# Patient Record
Sex: Female | Born: 1960 | Race: White | Hispanic: No | Marital: Married | Smoking: Never smoker
Health system: Southern US, Community
[De-identification: ages and names within clinical notes are randomized; demographics above are authoritative.]

---

## 2012-02-04 ENCOUNTER — Ambulatory Visit: Payer: Self-pay | Admitting: Unknown Physician Specialty

## 2012-02-06 LAB — PATHOLOGY REPORT

## 2012-11-27 ENCOUNTER — Ambulatory Visit: Payer: Self-pay | Admitting: Family Medicine

## 2013-06-04 ENCOUNTER — Ambulatory Visit: Payer: Self-pay | Admitting: Family Medicine

## 2013-07-01 ENCOUNTER — Ambulatory Visit: Payer: Self-pay | Admitting: Surgery

## 2013-11-30 ENCOUNTER — Ambulatory Visit: Payer: Self-pay | Admitting: Nurse Practitioner

## 2014-12-07 ENCOUNTER — Ambulatory Visit: Payer: Self-pay | Admitting: Family Medicine

## 2015-05-14 ENCOUNTER — Ambulatory Visit
Admission: EM | Admit: 2015-05-14 | Discharge: 2015-05-14 | Disposition: A | Discharge status: Home / Self Care | Payer: BLUE CROSS/BLUE SHIELD | Attending: Family Medicine | Admitting: Family Medicine

## 2015-05-14 ENCOUNTER — Ambulatory Visit: Payer: BLUE CROSS/BLUE SHIELD

## 2015-05-14 DIAGNOSIS — X58XXXA Exposure to other specified factors, initial encounter: Secondary | ICD-10-CM | POA: Diagnosis not present

## 2015-05-14 DIAGNOSIS — Y92481 Parking lot as the place of occurrence of the external cause: Secondary | ICD-10-CM | POA: Insufficient documentation

## 2015-05-14 DIAGNOSIS — S82892A Other fracture of left lower leg, initial encounter for closed fracture: Secondary | ICD-10-CM

## 2015-05-14 DIAGNOSIS — M79672 Pain in left foot: Secondary | ICD-10-CM | POA: Insufficient documentation

## 2015-05-14 DIAGNOSIS — E669 Obesity, unspecified: Secondary | ICD-10-CM | POA: Insufficient documentation

## 2015-05-14 DIAGNOSIS — M25572 Pain in left ankle and joints of left foot: Secondary | ICD-10-CM | POA: Diagnosis present

## 2015-05-14 DIAGNOSIS — Z6835 Body mass index (BMI) 35.0-35.9, adult: Secondary | ICD-10-CM | POA: Insufficient documentation

## 2015-05-14 DIAGNOSIS — Z79899 Other long term (current) drug therapy: Secondary | ICD-10-CM | POA: Insufficient documentation

## 2015-05-14 MED ORDER — IBUPROFEN 800 MG PO TABS: 800.0000 mg | ORAL_TABLET | Freq: Three times a day (TID) | ORAL | Status: DC

## 2015-05-14 NOTE — ED Provider Notes (Signed)
CSN: [id number]     Arrival date & time 05/14/15  1138 History   First MD Initiated Contact with Patient 05/14/15 1230     Chief Complaint  Patient presents with  . Ankle Pain  . 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  Painful to bear weight came in with crutches from home today.  Iced/rested/elevated/took advil 4 tabs OTC last night and this am and it helped for pain at rest but worsens as soon as she tries to move, worsening bruising this am.  Patient is a 54 y.o. female presenting with ankle pain and lower extremity pain. The history is provided by the patient.  Ankle Pain Location:  Ankle Time since incident:  1 day Injury: yes   Mechanism of injury: fall   Fall:    Fall occurred:  From a vehicle   Height of fall:  3 feet   Impact surface:  Primary school teacher of impact:  Unable to specify   Entrapped after fall: no   Ankle location:  L ankle Pain details:    Quality:  Aching and shooting   Radiates to:  Does not radiate   Severity:  Severe   Onset quality:  Sudden   Duration:  1 day   Timing:  Constant   Progression:  Unchanged Chronicity:  New Dislocation: no   Foreign body present:  No foreign bodies Tetanus status:  Out of date Prior injury to area:  No Relieved by:  Nothing Worsened by:  Bearing weight, flexion and rotation Ineffective treatments:  Ice, rest, immobilization and NSAIDs Associated symptoms: decreased ROM and swelling   Associated symptoms: no back pain, no fatigue, no fever, no itching, no muscle weakness, no neck pain, no numbness, no stiffness and no tingling   Risk factors: obesity     Risk factors: no concern for non-accidental trauma, no frequent fractures, no known bone disorder and no recent illness   Foot Pain Pertinent negatives include no chest pain, no abdominal pain, no headaches and no shortness of breath.    History reviewed. No pertinent past medical history. History reviewed. No pertinent past surgical history. Family History  Problem Relation Age of Onset  . Cancer Mother   . Heart disease Father   . Thyroid disease Sister    History  Substance Use Topics  . Smoking status: Never Smoker   . Smokeless tobacco: Not on file  . Alcohol Use: No   OB History    No data available     Review of Systems  Constitutional: Negative for fever, chills, diaphoresis, activity change, appetite change and fatigue.  HENT: Negative for ear pain, facial swelling and nosebleeds.   Eyes: Negative for pain, discharge and itching.  Respiratory: Negative for cough, shortness of breath, wheezing and stridor.   Cardiovascular: Negative for chest pain, palpitations and leg swelling.  Gastrointestinal: Negative for nausea, vomiting, abdominal pain, diarrhea, constipation and rectal pain.  Endocrine: Negative for cold intolerance and heat intolerance.  Genitourinary: Negative for difficulty urinating.  Musculoskeletal: Positive for myalgias, joint swelling, arthralgias and gait problem. Negative for back pain, stiffness, neck pain and neck stiffness.  Skin: Positive for color change. Negative for itching,  pallor, rash and wound.  Allergic/Immunologic: Negative for environmental allergies and food allergies.  Neurological: Negative for dizziness, tremors, syncope, facial asymmetry, weakness, light-headedness, numbness and headaches.  Hematological: Negative for adenopathy. Does not bruise/bleed easily.  Psychiatric/Behavioral: Negative for behavioral problems, confusion, sleep disturbance and agitation.    Allergies  Penicillins  Home Medications   Prior to Admission  medications   Medication Sig Start Date End Date Taking? Authorizing Provider  ibuprofen (ADVIL,MOTRIN) 800 MG tablet Take 1 tablet (800 mg total) by mouth 3 (three) times daily. 05/14/15   Barbaraann Barthel, NP   BP 119/82 mmHg  Pulse 102  Temp(Src) 98.7 F (37.1 C) (Tympanic)  Resp 17  Ht  (1.702 m)  Wt 225 lb (102.059 kg)  BMI 35.23 kg/m2  SpO2 98% Physical Exam  Constitutional: She is oriented to person, place, and time. Vital signs are normal. She appears well-developed and well-nourished.  HENT:  Head: Normocephalic and atraumatic.  Right Ear: External ear normal.  Left Ear: External ear normal.  Nose: Nose normal.  Mouth/Throat: Oropharynx is clear and moist. No oropharyngeal exudate.  Eyes: Conjunctivae, EOM and lids are normal. Pupils are equal, round, and reactive to light. Right eye exhibits no discharge. Left eye exhibits no discharge. No scleral icterus.  Neck: Trachea normal and normal range of motion. Neck supple. No JVD present. No tracheal deviation present.  Cardiovascular: Normal rate, regular rhythm, normal heart sounds and intact distal pulses.  Exam reveals no gallop and no friction rub.   No murmur heard. Pulmonary/Chest: Effort normal and breath sounds normal. No respiratory distress. She has no wheezes. She has no rales. She exhibits no tenderness.  Abdominal: Soft.  Musculoskeletal: She exhibits edema and tenderness.       Left ankle: She exhibits decreased range of motion, swelling and ecchymosis. She exhibits no deformity, no laceration and normal pulse. Tenderness. Lateral malleolus, medial malleolus and proximal fibula tenderness found. Achilles tendon normal. Achilles tendon exhibits no pain, no defect and normal Thompson's test results.       Feet:  Pain with dorsiflexion, eversion, inversion; using crutches or wheelchair does not want to bear weight toes to ground very painful with partial weight bearing; distal tib/fib squeeze very painful    Lymphadenopathy:    She has no cervical adenopathy.  Neurological: She is alert and oriented to person, place, and time. She displays normal reflexes. Coordination normal.  Skin: Skin is warm, dry and intact. Bruising and ecchymosis noted. No abrasion, no burn, no laceration, no lesion, no petechiae and no rash noted. She is not diaphoretic. No cyanosis or erythema. No pallor. Nails show no clubbing.     Psychiatric: She has a normal mood and affect. Her speech is normal and behavior is normal. Judgment and thought content normal. Cognition and memory are normal.  Nursing note and vitals reviewed.   ED Course  Procedures (including critical care time) Labs Review Labs Reviewed - No data to display  Imaging Review Dg Ankle Complete Left  05/14/2015   CLINICAL DATA:  Fall getting into her truck last evening. Lateral ankle pain. Unable to bear weight.  EXAM: LEFT ANKLE COMPLETE - 3+ VIEW  COMPARISON:  None.  FINDINGS: A minimally displaced avulsion fracture is present at the lateral malleolus. Moderate soft tissue swelling is present over the lateral malleolus. No additional fractures are present. There is no significant joint effusion. Calcaneal spurs are noted.  IMPRESSION: 1. Minimally displaced avulsion fracture of the lateral malleolus with associated  moderate soft tissue swelling.   Electronically Signed   By: Marin Robertshristopher  Mattern M.D.   On: 05/14/2015 13:25   Dg Foot Complete Left  05/14/2015   CLINICAL DATA:  Fall last night well getting into her truck. Unable bear weight. Swelling and bruising in the left foot and ankle laterally.  EXAM: LEFT FOOT - COMPLETE 3+ VIEW  COMPARISON:  None.  FINDINGS: Soft tissue swelling is present over the lateral aspect of the ankle. A minimally displaced fracture is present in the distal tibia. A secondary center of ossification is noted lateral of cuboid. No additional fractures are present. Calcaneal spurs are noted.  IMPRESSION: 1. Minimally displaced  fracture of the lateral malleolus with associated soft tissue swelling. 2. Calcaneal spurs.   Electronically Signed   By: Marin Robertshristopher  Mattern M.D.   On: 05/14/2015 13:24     MDM   1. Avulsion fracture of ankle, left, closed, initial encounter    Patient was instructed to rest, ice and elevate the ankle as much as possible.  Wear camboot, use crutches.  Activity as tolerated and work on gentle AROM exercises.  Patient is to take motrin 800mg  po TID prn.  Discussed at risk to reinjure ankle over the next year and to wear supportive footwear/ankle sleeve/ace bandage.  Schedule follow up with ortho/podiatry this week for re-evaluation--patient stated she will contact spouse provider in [place].  Given copy of radiology report but unable to get images on disk today from radiology.  Patient to contact radiology dept during regular M-F business hours if disk needed.  Work restriction note given to patient.  Patient verbalized agreement and understanding of treatment plan.   P2:  [employer] [facility] [employer].    Barbaraann Barthelina A Avien Taha, NP 05/14/15 [phone]

## 2015-05-14 NOTE — ED Notes (Signed)
States left foot ankle ?twisted? Yesterday after being startled and fell. Unable to bear weight

## 2015-05-14 NOTE — Discharge Instructions (Signed)
Ankle Fracture with Rehab Two bones in the ankle, the shinbone (tibia) and the bone of the outer ankle and lower leg (fibula), are susceptible to being fractured. The fracture may be a complete or an incomplete break of the bone. It is also common for the ligaments of the ankle joint to be injured at the same time as a fracture. SYMPTOMS   Severe pain in the ankle at the time of injury and/or when trying to move the ankle.  Feeling of popping or tearing in the inner or outer part of the ankle, sometimes as if the ankle joint was temporarily dislocated and popped back into place.  Cracking or other sounds may be heard at the time of fracture.  Severe tenderness in the ankle.  Swelling in the ankle and foot  Blisters around the ankle (uncommon).  Bleeding and bruising (contusion) in the ankle and foot.  Inability to stand or bear weight on the injured foot.  Visible deformity if the fracture is complete and the bone fragments separate enough to distort normal leg contours.  Numbness and coldness in the foot if the blood supply is impaired. CAUSES  Bones break when subjected to a force that is greater than the their strength. Most fractures are due to direct trauma, such as being hit with an object or falling.  Fractured may also be caused by indirect stress, such as twisting, pivoting, or violent muscle contraction . RISK INCREASES WITH:  Sports that require quick changes in direction (football, soccer, or skiing).  Sports that require jumping (basketball, volleyball, distance jumping, or high jumping).  Walking or running on uneven or rough surfaces.  Shoes with inadequate support to prevent the foot and ankle from rolling over when stress occurs.  Bony abnormalities (osteoporosis or bone tumors).  Metabolic disorders, hormone problems, and nutritional deficiencies and disorders.  Poor strength and flexibility  Previous ankle injury. PREVENTION   Warm up and stretch  properly before activity.  Maintain physical fitness:  Leg and ankle strength.  Flexibility and endurance.  Cardiovascular fitness.  Wear properly fitted protective equipment (high-top shoes or when appropriate and ankle bracing, taping, or splinting), especially for the first 12 months after an ankle injury. PROGNOSIS  If treated properly, ankle fractures typically heal well. RELATED COMPLICATIONS   Failure to heal (nonunion).  Healing in poor position (malunion).  Arrest of normal bone growth in children.  Proneness to repeated ankle injury.  Stiff ankle.  Unstable or arthritic ankle.  Infected skin blisters.  Prolonged healing time if activity is resumed too quickly. Risks of surgery, including infection, bleeding, injury to nerves (numbness, weakness, paralysis), and need for further surgery. TREATMENT  Treatment initially consists of ice and medication to help reduce pain and inflammation. The joint must be immobilized to allow for healing. If the fracture is where the bones are out of alignment (displaced), then surgery may be necessary to realign (reduce) them. Surgery usually involves placing pins and screws in the bones to hold them in place while the fracture heals. After surgery the joint is immobilized. Bone growth stimulators may be used to promote bone growth, but this is uncommon, Strengthening and stretching exercises are usually necessary after immobilization in order to regain strength and a full range of motion. These exercises may be completed at home or with a therapist. If pins and screws are placed in the bone, they are not usually removed unless they become a source of pain. MEDICATION   If pain medication is necessary,  then nonsteroidal anti-inflammatory medications, such as aspirin and ibuprofen, or other minor pain relievers, such as acetaminophen, are often recommended.  Do not take pain medication within 7 days before surgery.  Prescription pain  relievers may be prescribed if deemed necessary by your caregiver. Use only as directed and only as much as you need. SEEK MEDICAL CARE IF:   Symptoms get worse or do not improve in 2 weeks despite treatment.  The following occur after immobilization or surgery:  Swelling above or below the fracture site.  Severe, persistent pain.  Blue or gray skin below the fracture site, especially under the nails, or numbness or loss of feeling below the fracture site. Report any of these signs immediately.  New, unexplained symptoms develop (drugs used in treatment may produce side effects). EXERCISES  RANGE OF MOTION (ROM) AND STRETCHING EXERCISES - Ankle Fracture These exercises may help you when beginning to rehabilitate your injury. Your symptoms may resolve with or without further involvement from your physician, physical therapist or athletic trainer. While completing these exercises, remember:   Restoring tissue flexibility helps normal motion to return to the joints. This allows healthier, less painful movement and activity.  An effective stretch should be held for at least 30 seconds.  A stretch should never be painful. You should only feel a gentle lengthening or release in the stretched tissue. RANGE OF MOTION - Dorsi/Plantar Flexion  While sitting with your right / left knee straight, draw the top of your foot upwards by flexing your ankle. Then reverse the motion, pointing your toes downward.  Hold each position for __________ seconds.  After completing your first set of exercises, repeat this exercise with your knee bent. Repeat __________ times. Complete this exercise __________ times per day.  RANGE OF MOTION- Ankle Plantar Flexion   Sit with your right / left leg crossed over your opposite knee.  Use your opposite hand to pull the top of your foot and toes toward you.  You should feel a gentle stretch on the top of your foot/ankle. Hold this position for __________  seconds. Repeat __________ times. Complete __________ times per day.  RANGE OF MOTION - Ankle Eversion  Sit with your right / left ankle crossed over your opposite knee.  Grip your foot with your opposite hand, placing your thumb on the top of your foot and your fingers across the bottom of your foot.  Gently push your foot downward with a slight rotation so your littlest toes rise slightly  You should feel a gentle stretch on the inside of your ankle. Hold the stretch for __________ seconds. Repeat __________ times. Complete this exercise __________ times per day.  RANGE OF MOTION - Ankle Inversion  Sit with your right / left ankle crossed over your opposite knee.  Grip your foot with your opposite hand, placing your thumb on the bottom of your foot and your fingers across the top of your foot.  Gently pull your foot so the smallest toe comes toward you and your thumb pushes the inside of the ball of your foot away from you.  You should feel a gentle stretch on the outside of your ankle. Hold the stretch for __________ seconds. Repeat __________ times. Complete this exercise __________ times per day.  RANGE OF MOTION - Ankle Alphabet  Imagine your right / left big toe is a pen.  Keeping your hip and knee still, write out the entire alphabet with your "pen." Make the letters as large as you can  without increasing any discomfort. Repeat __________ times. Complete this exercise __________ times per day.  RANGE OF MOTION - Ankle Dorsiflexion, Active Assisted   Remove shoes and sit on a chair that is preferably not on a carpeted surface.  Place right / left foot under knee. Extend your opposite leg for support.  Keeping your heel down, slide your right / left foot back toward the chair until you feel a stretch at your ankle or calf. If you do not feel a stretch, slide your bottom forward to the edge of the chair, while still keeping your heel down.  Hold this stretch for __________  seconds. Repeat __________ times. Complete this stretch __________ times per day.  STRETCH - Gastrocsoleus   Sit with your right / left leg extended. Holding onto both ends of a belt or towel, loop it around the ball of your foot.  Keeping your right / left ankle and foot relaxed and your knee straight, pull your foot and ankle toward you using the belt/towel.  You should feel a gentle stretch behind your calf or knee. Hold this position for __________ seconds. Repeat __________ times. Complete this stretch __________ times per day.  STRENGTHENING EXERCISES - Ankle Fracture These exercises may help you when beginning to rehabilitate your injury. They may resolve your symptoms with or without further involvement from your physician, physical therapist or athletic trainer. While completing these exercises, remember:   Muscles can gain both the endurance and the strength needed for everyday activities through controlled exercises.  Complete these exercises as instructed by your physician, physical therapist or athletic trainer. Progress the resistance and repetitions only as guided.  You may experience muscle soreness or fatigue, but the pain or discomfort you are trying to eliminate should never worsen during these exercises. If this pain does worsen, stop and make certain you are following the directions exactly. If the pain is still present after adjustments, discontinue the exercise until you can discuss the trouble with your clinician. STRENGTH - Dorsiflexors  Secure a rubber exercise band/tubing to a fixed object (table, pole) and loop the other end around your right / left foot.  Sit on the floor facing the fixed object. The band/tubing should be slightly tense when your foot is relaxed.  Slowly draw your foot back toward you using your ankle and toes.  Hold this position for __________ seconds. Slowly release the tension in the band and return your foot to the starting  position. Repeat __________ times. Complete this exercise __________ times per day.  STRENGTH - Plantar-flexors  Sit with your right / left leg extended. Holding onto both ends of a rubber exercise band/tubing, loop it around the ball of your foot. Keep a slight tension in the band.  Slowly push your toes away from you, pointing them downward.  Hold this position for __________ seconds. Return slowly, controlling the tension in the band/tubing. Repeat __________ times. Complete this exercise __________ times per day.  STRENGTH - Ankle Eversion  Secure one end of a rubber exercise band/tubing to a fixed object (table, pole). Loop the other end around your foot just before your toes.  Place your fists between your knees. This will focus your strengthening at your ankle.  Drawing the band/tubing across your opposite foot, slowly, pull your little toe out and up. Make sure the band/tubing is positioned to resist the entire motion.  Hold this position for __________ seconds.  Have your muscles resist the band/tubing as it slowly pulls your foot  back to the starting position. Repeat __________ times. Complete this exercise __________ times per day.  STRENGTH - Ankle Inversion  Secure one end of a rubber exercise band/tubing to a fixed object (table, pole). Loop the other end around your foot just before your toes.  Place your fists between your knees. This will focus your strengthening at your ankle.  Slowly, pull your big toe up and in, making sure the band/tubing is positioned to resist the entire motion.  Hold this position for __________ seconds.  Have your muscles resist the band/tubing as it slowly pulls your foot back to the starting position. Repeat __________ times. Complete this exercises __________ times per day.  STRENGTH - Towel Curls  Sit in a chair positioned on a non-carpeted surface.  Place your foot on a towel, keeping your heel on the floor.  Pull the towel toward  your heel by only curling your toes. Keep your heel on the floor.  If instructed by your physician, physical therapist or athletic trainer, add weight at the end of the towel. Repeat __________ times. Complete this exercise __________ times per day. STRENGTH - Plantar-flexors, Standing   Stand with your feet shoulder width apart. Steady yourself with a wall or table using as little support as needed.  Keeping your weight evenly spread over the width of your feet, rise up on your toes.*  Hold this position for __________ seconds. Repeat __________ times. Complete this exercise __________ times per day.  *If this is too easy, shift your weight toward your right / left leg until you feel challenged. Ultimately, you may be asked to do this exercise with your right / left foot only. Document Released: 06/26/2005 Document Revised: 02/17/2012 Document Reviewed: 03/09/2009 [facility] Patient Information 2015 [place], [place]. This information is not intended to replace advice given to you by your health care provider. Make sure you discuss any questions you have with your health care provider. Acute Ankle Sprain with Phase I Rehab An acute ankle sprain is a partial or complete tear in one or more of the ligaments of the ankle due to traumatic injury. The severity of the injury depends on both the number of ligaments sprained and the grade of sprain. There are 3 grades of sprains.   A grade 1 sprain is a mild sprain. There is a slight pull without obvious tearing. There is no loss of strength, and the muscle and ligament are the correct length.  A grade 2 sprain is a moderate sprain. There is tearing of fibers within the substance of the ligament where it connects two bones or two cartilages. The length of the ligament is increased, and there is usually decreased strength.  A grade 3 sprain is a complete rupture of the ligament and is uncommon. In addition to the grade of sprain, there are three types of  ankle sprains.  Lateral ankle sprains: This is a sprain of one or more of the three ligaments on the outer side (lateral) of the ankle. These are the most common sprains. Medial ankle sprains: There is one large triangular ligament of the inner side (medial) of the ankle that is susceptible to injury. Medial ankle sprains are less common. Syndesmosis, "high ankle," sprains: The syndesmosis is the ligament that connects the two bones of the lower leg. Syndesmosis sprains usually only occur with very severe ankle sprains. SYMPTOMS  Pain, tenderness, and swelling in the ankle, starting at the side of injury that may progress to the whole ankle and foot  with time.  "Pop" or tearing sensation at the time of injury.  Bruising that may spread to the heel.  Impaired ability to walk soon after injury. CAUSES   Acute ankle sprains are caused by trauma placed on the ankle that temporarily forces or pries the anklebone (talus) out of its normal socket.  Stretching or tearing of the ligaments that normally hold the joint in place (usually due to a twisting injury). RISK INCREASES WITH:  Previous ankle sprain.  Sports in which the foot may land awkwardly (i.e., basketball, volleyball, or soccer) or walking or running on uneven or rough surfaces.  Shoes with inadequate support to prevent sideways motion when stress occurs.  Poor strength and flexibility.  Poor balance skills.  Contact sports. PREVENTION   Warm up and stretch properly before activity.  Maintain physical fitness:  Ankle and leg flexibility, muscle strength, and endurance.  Cardiovascular fitness.  Balance training activities.  Use proper technique and have a coach correct improper technique.  Taping, protective strapping, bracing, or high-top tennis shoes may help prevent injury. Initially, tape is best; however, it loses most of its support function within 10 to 15 minutes.  Wear proper-fitted protective shoes  (High-top shoes with taping or bracing is more effective than either alone).  Provide the ankle with support during sports and practice activities for 12 months following injury. PROGNOSIS   If treated properly, ankle sprains can be expected to recover completely; however, the length of recovery depends on the degree of injury.  A grade 1 sprain usually heals enough in 5 to 7 days to allow modified activity and requires an average of 6 weeks to heal completely.  A grade 2 sprain requires 6 to 10 weeks to heal completely.  A grade 3 sprain requires 12 to 16 weeks to heal.  A syndesmosis sprain often takes more than 3 months to heal. RELATED COMPLICATIONS   Frequent recurrence of symptoms may result in a chronic problem. Appropriately addressing the problem the first time decreases the frequency of recurrence and optimizes healing time. Severity of the initial sprain does not predict the likelihood of later instability.  Injury to other structures (bone, cartilage, or tendon).  A chronically unstable or arthritic ankle joint is a possibility with repeated sprains. TREATMENT Treatment initially involves the use of ice, medication, and compression bandages to help reduce pain and inflammation. Ankle sprains are usually immobilized in a walking cast or boot to allow for healing. Crutches may be recommended to reduce pressure on the injury. After immobilization, strengthening and stretching exercises may be necessary to regain strength and a full range of motion. Surgery is rarely needed to treat ankle sprains. MEDICATION   Nonsteroidal anti-inflammatory medications, such as aspirin and ibuprofen (do not take for the first 3 days after injury or within 7 days before surgery), or other minor pain relievers, such as acetaminophen, are often recommended. Take these as directed by your caregiver. Contact your caregiver immediately if any bleeding, stomach upset, or signs of an allergic reaction occur  from these medications.  Ointments applied to the skin may be helpful.  Pain relievers may be prescribed as necessary by your caregiver. Do not take prescription pain medication for longer than 4 to 7 days. Use only as directed and only as much as you need. HEAT AND COLD  Cold treatment (icing) is used to relieve pain and reduce inflammation for acute and chronic cases. Cold should be applied for 10 to 15 minutes every 2 to 3  hours for inflammation and pain and immediately after any activity that aggravates your symptoms. Use ice packs or an ice massage.  Heat treatment may be used before performing stretching and strengthening activities prescribed by your caregiver. Use a heat pack or a warm soak. SEEK IMMEDIATE MEDICAL CARE IF:   Pain, swelling, or bruising worsens despite treatment.  You experience pain, numbness, discoloration, or coldness in the foot or toes.  New, unexplained symptoms develop (drugs used in treatment may produce side effects.) EXERCISES  PHASE I EXERCISES RANGE OF MOTION (ROM) AND STRETCHING EXERCISES - Ankle Sprain, Acute Phase I, Weeks 1 to 2 These exercises may help you when beginning to restore flexibility in your ankle. You will likely work on these exercises for the 1 to 2 weeks after your injury. Once your physician, physical therapist, or athletic trainer sees adequate progress, he or she will advance your exercises. While completing these exercises, remember:   Restoring tissue flexibility helps normal motion to return to the joints. This allows healthier, less painful movement and activity.  An effective stretch should be held for at least 30 seconds.  A stretch should never be painful. You should only feel a gentle lengthening or release in the stretched tissue. RANGE OF MOTION - Dorsi/Plantar Flexion  While sitting with your right / left knee straight, draw the top of your foot upwards by flexing your ankle. Then reverse the motion, pointing your toes  downward.  Hold each position for __________ seconds.  After completing your first set of exercises, repeat this exercise with your knee bent. Repeat __________ times. Complete this exercise __________ times per day.  RANGE OF MOTION - Ankle Alphabet  Imagine your right / left big toe is a pen.  Keeping your hip and knee still, write out the entire alphabet with your "pen." Make the letters as large as you can without increasing any discomfort. Repeat __________ times. Complete this exercise __________ times per day.  STRENGTHENING EXERCISES - Ankle Sprain, Acute -Phase I, Weeks 1 to 2 These exercises may help you when beginning to restore strength in your ankle. You will likely work on these exercises for 1 to 2 weeks after your injury. Once your physician, physical therapist, or athletic trainer sees adequate progress, he or she will advance your exercises. While completing these exercises, remember:   Muscles can gain both the endurance and the strength needed for everyday activities through controlled exercises.  Complete these exercises as instructed by your physician, physical therapist, or athletic trainer. Progress the resistance and repetitions only as guided.  You may experience muscle soreness or fatigue, but the pain or discomfort you are trying to eliminate should never worsen during these exercises. If this pain does worsen, stop and make certain you are following the directions exactly. If the pain is still present after adjustments, discontinue the exercise until you can discuss the trouble with your clinician. STRENGTH - Dorsiflexors  Secure a rubber exercise band/tubing to a fixed object (i.e., table, pole) and loop the other end around your right / left foot.  Sit on the floor facing the fixed object. The band/tubing should be slightly tense when your foot is relaxed.  Slowly draw your foot back toward you using your ankle and toes.  Hold this position for __________  seconds. Slowly release the tension in the band and return your foot to the starting position. Repeat __________ times. Complete this exercise __________ times per day.  STRENGTH - Plantar-flexors   Sit with your  right / left leg extended. Holding onto both ends of a rubber exercise band/tubing, loop it around the ball of your foot. Keep a slight tension in the band.  Slowly push your toes away from you, pointing them downward.  Hold this position for __________ seconds. Return slowly, controlling the tension in the band/tubing. Repeat __________ times. Complete this exercise __________ times per day.  STRENGTH - Ankle Eversion  Secure one end of a rubber exercise band/tubing to a fixed object (table, pole). Loop the other end around your foot just before your toes.  Place your fists between your knees. This will focus your strengthening at your ankle.  Drawing the band/tubing across your opposite foot, slowly, pull your little toe out and up. Make sure the band/tubing is positioned to resist the entire motion.  Hold this position for __________ seconds. Have your muscles resist the band/tubing as it slowly pulls your foot back to the starting position.  Repeat __________ times. Complete this exercise __________ times per day.  STRENGTH - Ankle Inversion  Secure one end of a rubber exercise band/tubing to a fixed object (table, pole). Loop the other end around your foot just before your toes.  Place your fists between your knees. This will focus your strengthening at your ankle.  Slowly, pull your big toe up and in, making sure the band/tubing is positioned to resist the entire motion.  Hold this position for __________ seconds.  Have your muscles resist the band/tubing as it slowly pulls your foot back to the starting position. Repeat __________ times. Complete this exercises __________ times per day.  STRENGTH - Towel Curls  Sit in a chair positioned on a non-carpeted  surface.  Place your right / left foot on a towel, keeping your heel on the floor.  Pull the towel toward your heel by only curling your toes. Keep your heel on the floor.  If instructed by your physician, physical therapist, or athletic trainer, add weight to the end of the towel. Repeat __________ times. Complete this exercise __________ times per day. Document Released: 06/26/2005 Document Revised: 04/11/2014 Document Reviewed: 03/09/2009 [facility] Patient Information 2015 [place], [place]. This information is not intended to replace advice given to you by your health care provider. Make sure you discuss any questions you have with your health care provider.

## 2016-10-22 ENCOUNTER — Ambulatory Visit
Admission: EM | Admit: 2016-10-22 | Discharge: 2016-10-22 | Disposition: A | Discharge status: Home / Self Care | Payer: BLUE CROSS/BLUE SHIELD | Attending: Family Medicine | Admitting: Family Medicine

## 2016-10-22 ENCOUNTER — Ambulatory Visit (INDEPENDENT_AMBULATORY_CARE_PROVIDER_SITE_OTHER): Payer: BLUE CROSS/BLUE SHIELD

## 2016-10-22 ENCOUNTER — Encounter: Payer: Self-pay | Admitting: *Deleted

## 2016-10-22 DIAGNOSIS — W19XXXA Unspecified fall, initial encounter: Secondary | ICD-10-CM | POA: Diagnosis not present

## 2016-10-22 DIAGNOSIS — S63501A Unspecified sprain of right wrist, initial encounter: Secondary | ICD-10-CM

## 2016-10-22 NOTE — ED Provider Notes (Signed)
CSN: [id number]     Arrival date & time 10/22/16  1557 History   First MD Initiated Contact with Patient 10/22/16 1714     Chief Complaint  Patient presents with  . Wrist Pain   (Consider location/radiation/quality/duration/timing/severity/associated sxs/prior Treatment) HPI  55 year old female who states last night she slipped on a dog poorly in the kitchen and fell landing on her stretched right dominant hand to break her fall. He says that that today it seemed to hurt worse particularly using the track ball at work. He has noticed some swelling over the distal forearm.       History reviewed. No pertinent past medical history. History reviewed. No pertinent surgical history. Family History  Problem Relation Age of Onset  . Cancer Mother   . Heart disease Father   . Thyroid disease Sister    Social History  Substance Use Topics  . Smoking status: Never Smoker  . Smokeless tobacco: Never Used  . Alcohol use No   OB History    No data available     Review of Systems  Constitutional: Positive for activity change. Negative for chills and fatigue.  Musculoskeletal: Positive for arthralgias and myalgias.  All other systems reviewed and are negative.   Allergies  Penicillins  Home Medications   Prior to Admission medications   Medication Sig Start Date End Date Taking? Authorizing Provider  ibuprofen (ADVIL,MOTRIN) 800 MG tablet Take 1 tablet (800 mg total) by mouth 3 (three) times daily. 05/14/15   Barbaraann Barthelina A Betancourt, NP   Meds Ordered and Administered this Visit  Medications - No data to display  BP 118/82 (BP Location: Left Arm)   Pulse 80   Temp 98.6 F (37 C) (Oral)   Resp 16   Ht 5\' 8"  (1.727 m)   Wt 228 lb (103.4 kg)   SpO2 97%   BMI 34.67 kg/m  No data found.   Physical Exam  Constitutional: She is oriented to person, place, and time. She appears well-developed and well-nourished. No distress.  HENT:  Head: Normocephalic and atraumatic.  Eyes: EOM  are normal. Pupils are equal, round, and reactive to light.  Neck: Normal range of motion. Neck supple.  Musculoskeletal: She exhibits edema and tenderness. She exhibits no deformity.  Examination of the right been evaluated by hand and wrist show some swelling over the dorsum of the distal forearm. There is no ecchymosis or erythema present. Range of motion shows approximately 5-10 of full dorsiflexion. Volar is normal. Pronation supination are intact. Tenderness is over the distal forearm where the swelling is maximal. Some mild tenderness over the carpal navicular is only slightly less than that on the left. Motion of the fingers is full and Comfortable normal.  Neurological: She is alert and oriented to person, place, and time.  Skin: Skin is warm and dry. She is not diaphoretic.  Psychiatric: She has a normal mood and affect. Her behavior is normal. Judgment and thought content normal.  Nursing note and vitals reviewed.   Urgent Care Course   Clinical Course     Procedures (including critical care time)  Labs Review Labs Reviewed - No data to display  Imaging Review Dg Forearm Right  Result Date: 10/22/2016 CLINICAL DATA:  Fall. Pain and swelling along the radial styloid and forearm. EXAM: RIGHT FOREARM - 2 VIEW COMPARISON:  None. FINDINGS: Possible dorsal soft tissue swelling along the forearm. Slightly irregular tip of the ulnar styloid process but without obvious cortical discontinuity. No radial fracture  is identified. IMPRESSION: 1. Soft tissue swelling dorsally along the forearm. No radial or ulnar fracture identified. Electronically Signed   By: Gaylyn RongWalter  Liebkemann M.D.   On: 10/22/2016 17:13   Dg Wrist Complete Right  Result Date: 10/22/2016 CLINICAL DATA:  Fall. Pain and swelling along the radial styloid process. EXAM: RIGHT WRIST - COMPLETE 3+ VIEW COMPARISON:  None. FINDINGS: No fracture of the radial styloid is identified. Slight irregularity of the tip of the ulnar  styloid could be from an old injury but I do not see current bony discontinuity to suggest acute fracture. Pronator fat pad unremarkable. There is some slight mid scaphoid ridging without definite cortical discontinuity. IMPRESSION: 1. Mild chronic appearing irregularity of the ulnar styloid tip, no definite cortical discontinuity to suggest fracture. 2. Slight mid scaphoid ridging without a definite scaphoid fracture; consider cross-sectional imaging if the patient has pain directly over the anatomic snuffbox to exclude occult scaphoid injury. Electronically Signed   By: Gaylyn RongWalter  Liebkemann M.D.   On: 10/22/2016 17:18     Visual Acuity Review  Right Eye Distance:   Left Eye Distance:   Bilateral Distance:    Right Eye Near:   Left Eye Near:    Bilateral Near:     Patient was given a wrist splint    MDM   1. Fall, initial encounter   2. Sprain of right wrist, initial encounter    Postoperative patient in a wrist splint for comfort. She can come out of it for personal care. Recommended elevation and ice as much as feasible tonight tomorrow. She can use Motrin or Advil for pain control. Discussed with her possible fracture of the carpal navicular which may be occult. If she continues to have problems next week with pain or discomfort with with use she follow-up with an orthopedic surgeon.    Lutricia FeilWilliam P Raeanna Soberanes, PA-C 10/22/16 1755

## 2016-10-22 NOTE — ED Triage Notes (Signed)
Pt slipped and fell last night at home. Landed on right arm. Now c/o right wrist pain and edema.

## 2016-10-25 ENCOUNTER — Telehealth: Payer: Self-pay | Admitting: *Deleted

## 2016-10-25 NOTE — Telephone Encounter (Signed)
Courtesy call back, no answer, left message for patient to call back if any questions or concerns should arise.

## 2017-11-21 ENCOUNTER — Encounter (INDEPENDENT_AMBULATORY_CARE_PROVIDER_SITE_OTHER): Payer: Self-pay | Admitting: Orthopaedic Surgery

## 2017-11-21 ENCOUNTER — Ambulatory Visit (INDEPENDENT_AMBULATORY_CARE_PROVIDER_SITE_OTHER): Payer: BLUE CROSS/BLUE SHIELD

## 2017-11-21 ENCOUNTER — Ambulatory Visit (INDEPENDENT_AMBULATORY_CARE_PROVIDER_SITE_OTHER): Payer: BLUE CROSS/BLUE SHIELD | Admitting: Orthopaedic Surgery

## 2017-11-21 VITALS — BP 120/76 | HR 69 | Ht 67.0 in | Wt 204.0 lb

## 2017-11-21 DIAGNOSIS — M545 Low back pain: Secondary | ICD-10-CM | POA: Diagnosis not present

## 2017-11-21 DIAGNOSIS — G8929 Other chronic pain: Secondary | ICD-10-CM

## 2017-11-21 NOTE — Progress Notes (Signed)
Office Visit Note   Patient: Jessica Cummings           Date of Birth: 10/17/1961           MRN: [id number] Visit Date: 11/21/2017              Requested by: Rolm GalaGrandis, Heidi, MD [address] [place], [place] [id number] PCP: Rolm GalaGrandis, Heidi, MD   Assessment & Plan: Visit Diagnoses:  1. Chronic bilateral low back pain, with sciatica presence unspecified     Plan: We reviewed her x-rays which demonstrates mild narrowing at L5-S1 with some degenerative facet changes.  No evidence of radiculopathy on exam she is neurologically intact.  To work on a walking program, core strengthening, and weight loss to improve her symptoms.  We reviewed symptoms of radiculopathy claudication and if these develop she can return.  We discussed  Using NSAIDs  PRN as needed.  She will return if she has increased symptoms.  Follow-Up Instructions: No Follow-up on file.   Orders:  Orders Placed This Encounter  Procedures  . XR Lumbar Spine 2-3 Views   No orders of the defined types were placed in this encounter.     Procedures: No procedures performed   Clinical Data: No additional findings.   Subjective: Chief Complaint  Patient presents with  . Lower Back - Pain    HPI [age] year old female seen with complaint of back pain.  States she has pain when she stands and sometimes shoots down her legs to her knees.  Both thighs.  She feels a popping in her back.  Sometimes onset of pain is when she is walking it tends to get better.  No neurogenic claudication symptoms.  Bowel or bladder symptoms.  Had ibuprofen in the past for this 800 mg but is currently not taking it.  Review of Systems healthy she had an ankle sprain last year.  Smoked is alcohol.  Positive for the occasional headaches.  Previous cystoscopy.  Mild hearing loss.  Otherwise negative as it pertains HPI.   Objective: Vital Signs: BP 120/76   Pulse 69   Ht 5\' 7"  (1.702 m)   Wt 204 lb (92.5 kg)   BMI 31.95 kg/m   Physical Exam    Constitutional: She is oriented to person, place, and time. She appears well-developed.  HENT:  Head: Normocephalic.  Right Ear: External ear normal.  Left Ear: External ear normal.  Slight decrease hearing.   Eyes: Pupils are equal, round, and reactive to light.  Neck: No tracheal deviation present. No thyromegaly present.  Cardiovascular: Normal rate.  Pulmonary/Chest: Effort normal.  Abdominal: Soft.  Neurological: She is alert and oriented to person, place, and time.  Skin: Skin is warm and dry.  Psychiatric: She has a normal mood and affect. Her behavior is normal.    Ortho Exam flexion.  Negative straight leg raising negative logroll.  Knee and ankle jerk are 2+ and symmetrical.  No isolated motor weakness.  Patient is able to heel and toe walk.  Pulses are 2+.No  Quad or calf atrophy.  Peroneals are strong.  Specialty Comments:  No specialty comments available.  Imaging: Xr Lumbar Spine 2-3 Views  Result Date: 11/21/2017 AP lateral lumbar spine x-rays demonstrate some L4-5 facet spurring without disc space narrowing.  No scoliosis.  Mild L5-S1 disc narrowing.  Hip joints and pelvis are normal. Impression: Slight narrowing L5-S1 disc and slight lumbar facet degenerative changes.    PMFS History: There are no active  problems to display for this patient.  No past medical history on file.  Family History  Problem Relation Age of Onset  . Cancer Mother   . Heart disease Father   . Thyroid disease Sister     No past surgical history on file. Social History   Occupational History  . Not on file  Tobacco Use  . Smoking status: Never Smoker  . Smokeless tobacco: Never Used  Substance and Sexual Activity  . Alcohol use: No  . Drug use: Not on file  . Sexual activity: Not on file

## 2017-11-25 ENCOUNTER — Encounter (INDEPENDENT_AMBULATORY_CARE_PROVIDER_SITE_OTHER): Payer: Self-pay | Admitting: Orthopaedic Surgery

## 2018-08-26 ENCOUNTER — Encounter: Payer: Self-pay | Admitting: Emergency Medicine

## 2018-08-26 ENCOUNTER — Other Ambulatory Visit: Payer: Self-pay

## 2018-08-26 ENCOUNTER — Ambulatory Visit (INDEPENDENT_AMBULATORY_CARE_PROVIDER_SITE_OTHER): Payer: BLUE CROSS/BLUE SHIELD

## 2018-08-26 ENCOUNTER — Ambulatory Visit
Admission: EM | Admit: 2018-08-26 | Discharge: 2018-08-26 | Disposition: A | Discharge status: Home / Self Care | Payer: BLUE CROSS/BLUE SHIELD | Attending: Family Medicine | Admitting: Family Medicine

## 2018-08-26 DIAGNOSIS — M778 Other enthesopathies, not elsewhere classified: Secondary | ICD-10-CM

## 2018-08-26 DIAGNOSIS — M65842 Other synovitis and tenosynovitis, left hand: Secondary | ICD-10-CM

## 2018-08-26 DIAGNOSIS — M779 Enthesopathy, unspecified: Principal | ICD-10-CM

## 2018-08-26 DIAGNOSIS — M79642 Pain in left hand: Secondary | ICD-10-CM

## 2018-08-26 DIAGNOSIS — M79645 Pain in left finger(s): Secondary | ICD-10-CM

## 2018-08-26 MED ORDER — HYDROCODONE-ACETAMINOPHEN 5-325 MG PO TABS: ORAL_TABLET | ORAL | 0 refills | Status: DC

## 2018-08-26 NOTE — ED Triage Notes (Signed)
Patient c/o left thumb pain that started on Monday. She stated that this morning she woke up and now has pain in her thumb and pointer finger. Patient denies injury.

## 2018-08-26 NOTE — ED Provider Notes (Signed)
[facility]    CSN: [id number] Arrival date & time: 08/26/18  1804     History   Chief Complaint Chief Complaint  Patient presents with  . Hand Pain    HPI Galya Dunnigan is a 57 y.o. female.   57 yo female with a c/o left hand/finger pain x 3 days after doing yard work over the weekend. Does not recall any specific traumatic injury.   The history is provided by the patient.  Hand Pain     History reviewed. No pertinent past medical history.  There are no active problems to display for this patient.   History reviewed. No pertinent surgical history.  OB History   None      Home Medications    Prior to Admission medications   Medication Sig Start Date End Date Taking? Authorizing Provider  HYDROcodone-acetaminophen (NORCO/VICODIN) 5-325 MG tablet 1-2 tabs po qd 08/26/18   Payton Mccallum, MD  ibuprofen (ADVIL,MOTRIN) 800 MG tablet Take 1 tablet (800 mg total) by mouth 3 (three) times daily. Patient not taking: Reported on 11/21/2017 05/14/15   Barbaraann Barthel, NP    Family History Family History  Problem Relation Age of Onset  . Cancer Mother   . Heart disease Father   . Thyroid disease Sister     Social History Social History   Tobacco Use  . Smoking status: Never Smoker  . Smokeless tobacco: Never Used  Substance Use Topics  . Alcohol use: No  . Drug use: Never     Allergies   Penicillins   Review of Systems Review of Systems   Physical Exam Triage Vital Signs ED Triage Vitals [08/26/18 1826]  Enc Vitals Group     BP 128/79     Pulse Rate 92     Resp 18     Temp 98.3 F (36.8 C)     Temp Source Oral     SpO2 97 %     Weight 215 lb (97.5 kg)     Height 5\' 7"  (1.702 m)     Head Circumference      Peak Flow      Pain Score 0     Pain Loc      Pain Edu?      Excl. in GC?    No data found.  Updated Vital Signs BP 128/79 (BP Location: Left Arm)   Pulse 92   Temp 98.3 F (36.8 C) (Oral)   Resp 18   Ht 5'  7" (1.702 m)   Wt 97.5 kg   SpO2 97%   BMI 33.67 kg/m   Visual Acuity Right Eye Distance:   Left Eye Distance:   Bilateral Distance:    Right Eye Near:   Left Eye Near:    Bilateral Near:     Physical Exam  Constitutional: She appears well-developed and well-nourished. No distress.  Musculoskeletal:       Left hand: She exhibits tenderness (over extensor tendons) and bony tenderness. She exhibits normal range of motion, normal two-point discrimination, normal capillary refill, no deformity, no laceration and no swelling. Normal sensation noted. Normal strength noted.  Skin: She is not diaphoretic.  Nursing note and vitals reviewed.    UC Treatments / Results  Labs (all labs ordered are listed, but only abnormal results are displayed) Labs Reviewed - No data to display  EKG None  Radiology Dg Hand Complete Left  Result Date: 08/26/2018 CLINICAL DATA:  Patient c/o left thumb pain  that started on Monday. She stated that this morning she woke up and now has pain in her thumb and pointer finger. Patient denies injury. EXAM: LEFT HAND - COMPLETE 3+ VIEW COMPARISON:  None. FINDINGS: No acute fracture or dislocation. Mild degenerative changes, including joint space narrowing in the distal interphalangeal joints of the second and third digits. Normal bone mineralization. No periarticular erosions. 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 Electronically Signed   By: Jeronimo GreavesKyle  Talbot M.D.   On: 08/26/2018 19:11    Procedures Procedures (including critical care time)  Medications Ordered in UC Medications - No data to display  Initial Impression / Assessment and Plan / UC Course  I have reviewed the triage vital signs and the nursing notes.  Pertinent labs & imaging results that were available during my care of the patient were reviewed by me and considered in my medical decision making (see chart for details).      Final Clinical Impressions(s) / UC  Diagnoses   Final diagnoses:  Left hand tendonitis     Discharge Instructions     Rest, ice, elevation    ED Prescriptions    Medication Sig Dispense Auth. Provider   HYDROcodone-acetaminophen (NORCO/VICODIN) 5-325 MG tablet 1-2 tabs po qd 6 tablet Johnsie Moscoso, Pamala Hurryrlando, MD      1. x-ray results and diagnosis reviewed with patient 2. rx as per orders above; reviewed possible side effects, interactions, risks and benefits  3. Recommend supportive treatment with rest, ice, elevation, otc analgesics prn 4. Follow-up prn if symptoms worsen or don't improve  Controlled Substance Prescriptions [place] Controlled Substance Registry consulted? Not Applicable   Payton Mccallumonty, Kamille Toomey, MD 08/26/18 2029

## 2018-08-26 NOTE — Discharge Instructions (Signed)
Rest, ice, elevation °

## 2019-06-22 ENCOUNTER — Ambulatory Visit
Admission: EM | Admit: 2019-06-22 | Discharge: 2019-06-22 | Disposition: A | Discharge status: Home / Self Care | Payer: BC Managed Care – PPO | Attending: Family | Admitting: Family

## 2019-06-22 ENCOUNTER — Ambulatory Visit (INDEPENDENT_AMBULATORY_CARE_PROVIDER_SITE_OTHER): Payer: BC Managed Care – PPO

## 2019-06-22 ENCOUNTER — Other Ambulatory Visit: Payer: Self-pay

## 2019-06-22 ENCOUNTER — Encounter: Payer: Self-pay | Admitting: Emergency Medicine

## 2019-06-22 DIAGNOSIS — M25571 Pain in right ankle and joints of right foot: Secondary | ICD-10-CM

## 2019-06-22 DIAGNOSIS — M25471 Effusion, right ankle: Secondary | ICD-10-CM | POA: Diagnosis not present

## 2019-06-22 DIAGNOSIS — S93601A Unspecified sprain of right foot, initial encounter: Secondary | ICD-10-CM | POA: Diagnosis not present

## 2019-06-22 DIAGNOSIS — M79671 Pain in right foot: Secondary | ICD-10-CM | POA: Diagnosis not present

## 2019-06-22 DIAGNOSIS — S80211A Abrasion, right knee, initial encounter: Secondary | ICD-10-CM

## 2019-06-22 DIAGNOSIS — W19XXXA Unspecified fall, initial encounter: Secondary | ICD-10-CM

## 2019-06-22 DIAGNOSIS — S80212A Abrasion, left knee, initial encounter: Secondary | ICD-10-CM | POA: Diagnosis not present

## 2019-06-22 MED ORDER — NAPROXEN 500 MG PO TABS: 500.0000 mg | ORAL_TABLET | Freq: Two times a day (BID) | ORAL | 0 refills | Status: AC | PRN

## 2019-06-22 NOTE — ED Triage Notes (Signed)
Patient states she slipped and rolled her ankle yesterday.  Patient states she was able to walk on it yesterday but cannot bear weight today.

## 2019-06-22 NOTE — Discharge Instructions (Addendum)
Recommend take Naproxen 500mg  twice a day as directed for pain and swelling. Keep foot elevated as much as possible. Use ace wrap and foot boot as well as crutches to minimize weight-bearing. Continue to apply ice to area today. Recommend follow-up with Dr. Vickki Muff, Podiatrist in [place] in 3 days if not improving.

## 2019-06-22 NOTE — ED Provider Notes (Signed)
[facility]    CSN: [id number] Arrival date & time: 06/22/19  0945     History   Chief Complaint Chief Complaint  Patient presents with  . Ankle Pain    HPI Jessica Cummings is a 58 y.o. female.   58 year old female presents with injury to her right ankle and foot. She was walking outside yesterday when she slipped on some rocks in the side of the road and fell down, scraping her knees and twisting her right ankle and foot. She was able to bear weight yesterday and walked home. This morning she woke up with increased pain and inability to bear weight on her right foot- particularly along the 5th metatarsal area and plantar area. Also noticed increased swelling of the outside of her ankle. She had applied ice yesterday and has tried to elevate her foot but takes care of her husband and 4 dogs which makes it difficult to sit still. She has taken Tylenol with minimal relief. No distinct previous injury to her foot. No numbness or decreased sensation. No other chronic health issues. Takes daily vitamins. Has multiple questions regarding injury and concern over possible fracture.   The history is provided by the patient.    History reviewed. No pertinent past medical history.  There are no active problems to display for this patient.   History reviewed. No pertinent surgical history.  OB History   No obstetric history on file.      Home Medications    Prior to Admission medications   Medication Sig Start Date End Date Taking? Authorizing Provider  naproxen (NAPROSYN) 500 MG tablet Take 1 tablet (500 mg total) by mouth 2 (two) times daily as needed for moderate pain. 06/22/19   Sudie GrumblingAmyot, Jamilya Sarrazin Berry, NP    Family History Family History  Problem Relation Age of Onset  . Cancer Mother   . Heart disease Father   . Thyroid disease Sister     Social History Social History   Tobacco Use  . Smoking status: Never Smoker  . Smokeless tobacco: Never Used  Substance  Use Topics  . Alcohol use: No  . Drug use: Never     Allergies   Penicillins   Review of Systems Review of Systems  Constitutional: Negative for activity change, appetite change, chills, fatigue and fever.  Eyes: Negative for photophobia and visual disturbance.  Respiratory: Negative for cough, chest tightness, shortness of breath and wheezing.   Cardiovascular: Negative for chest pain.  Gastrointestinal: Positive for nausea (due to pain). Negative for vomiting.  Musculoskeletal: Positive for arthralgias, gait problem and joint swelling.  Skin: Positive for color change and wound (both knees). Negative for rash.  Allergic/Immunologic: Negative for environmental allergies, food allergies and immunocompromised state.  Neurological: Negative for dizziness, tremors, seizures, syncope, weakness, light-headedness, numbness and headaches.  Hematological: Negative for adenopathy. Does not bruise/bleed easily.  Psychiatric/Behavioral: The patient is nervous/anxious.      Physical Exam Triage Vital Signs ED Triage Vitals  Enc Vitals Group     BP 06/22/19 1033 132/79     Pulse Rate 06/22/19 1033 87     Resp 06/22/19 1033 18     Temp 06/22/19 1033 98 F (36.7 C)     Temp src --      SpO2 06/22/19 1033 100 %     Weight 06/22/19 1024 225 lb (102.1 kg)     Height 06/22/19 1024 5\' 7"  (1.702 m)     Head Circumference --  Peak Flow --      Pain Score 06/22/19 1023 10     Pain Loc --      Pain Edu? --      Excl. in GC? --    No data found.  Updated Vital Signs BP 132/79 (BP Location: Left Arm)   Pulse 87   Temp 98 F (36.7 C)   Resp 18   Ht 5\' 7"  (1.702 m)   Wt 225 lb (102.1 kg)   SpO2 100%   BMI 35.24 kg/m   Visual Acuity Right Eye Distance:   Left Eye Distance:   Bilateral Distance:    Right Eye Near:   Left Eye Near:    Bilateral Near:     Physical Exam Vitals signs and nursing note reviewed.  Constitutional:      General: She is awake. She is not in acute  distress.    Appearance: She is well-developed and well-groomed. She is not ill-appearing.     Comments: Patient sitting in wheel chair in no acute distress but unable to bear weight due to right foot pain.   HENT:     Head: Normocephalic and atraumatic.     Right Ear: External ear normal.     Left Ear: External ear normal.  Eyes:     Extraocular Movements: Extraocular movements intact.     Conjunctiva/sclera: Conjunctivae normal.  Neck:     Musculoskeletal: Normal range of motion.  Cardiovascular:     Rate and Rhythm: Normal rate.     Pulses:          Dorsalis pedis pulses are 2+ on the right side and 2+ on the left side.       Posterior tibial pulses are 2+ on the right side and 2+ on the left side.  Pulmonary:     Effort: Pulmonary effort is normal.  Musculoskeletal:        General: Swelling and tenderness present.     Right foot: Normal range of motion.     Left foot: Normal range of motion.       Feet:  Feet:     Right foot:     Skin integrity: Callus present. No erythema or warmth.     Toenail Condition: Right toenails are normal.     Left foot:     Skin integrity: Skin integrity normal.     Toenail Condition: Left toenails are normal.     Comments: Has full range of motion of right ankle and foot. Swelling present at lateral malleolus but minimal tenderness. No swelling but tender and slight bruising present along 5th metatarsal area. Tender on plantar surface as well. No heel pain. Normal pulses and good capillary refill. No neuro deficits noted.  Skin:    General: Skin is warm.     Capillary Refill: Capillary refill takes less than 2 seconds.     Findings: Abrasion and bruising present. No erythema or rash.          Comments: Small abrasion present on right and left patellar area of knees. Left worse than right. No active bleeding. Slightly tender. Has full range of motion of knees and no neuro deficits noted.   Neurological:     General: No focal deficit present.      Mental Status: She is alert and oriented to person, place, and time.     Sensory: Sensation is intact.     Motor: Motor function is intact.  Psychiatric:  Attention and Perception: Attention normal.        Mood and Affect: Affect normal. Mood is anxious.        Speech: Speech normal.        Behavior: Behavior is cooperative.        Thought Content: Thought content normal.      UC Treatments / Results  Labs (all labs ordered are listed, but only abnormal results are displayed) Labs Reviewed - No data to display  EKG   Radiology Dg Ankle Complete Right  Result Date: 06/22/2019 CLINICAL DATA:  Fall yesterday, swelling lateral malleolus. EXAM: RIGHT ANKLE - COMPLETE 3+ VIEW COMPARISON:  None. FINDINGS: Osseous alignment is normal. Ankle mortise is symmetric. No fracture line or displaced fracture fragment seen. Visualized portions of the hindfoot and midfoot appear intact and normally aligned. Chronic spurring noted at the dorsal and plantar margins of the calcaneus. Soft tissues about the RIGHT ankle are unremarkable. IMPRESSION: No acute findings. No osseous fracture or dislocation. Electronically Signed   By: Franki Cabot M.D.   On: 06/22/2019 11:25   Dg Foot Complete Right  Result Date: 06/22/2019 CLINICAL DATA:  Fall yesterday, soft tissue swelling. Pain at ball of foot with pressure. EXAM: RIGHT FOOT COMPLETE - 3+ VIEW COMPARISON:  None. FINDINGS: Osseous alignment is normal. No fracture line or acutely displaced fracture fragment seen. Soft tissues about the RIGHT foot are unremarkable. Chronic degenerative spurring at the plantar and dorsal margins of the calcaneus. IMPRESSION: No acute findings. No osseous fracture or dislocation. Electronically Signed   By: Franki Cabot M.D.   On: 06/22/2019 11:24    Procedures Procedures (including critical care time)  Medications Ordered in UC Medications - No data to display  Initial Impression / Assessment and Plan / UC Course   I have reviewed the triage vital signs and the nursing notes.  Pertinent labs & imaging results that were available during my care of the patient were reviewed by me and considered in my medical decision making (see chart for details).    Reviewed x-ray results with patient- no distinct fracture. Patient in disbelief that she did not fracture a bone since she experienced so much pain. Discussed that sprain and strains can be extremely painful. Also possible of more extensive ligament or tendon damage that can not be determined by x-ray. Discussed that if pain and symptoms do not improve, she should follow-up with a Podiatrist.  Recommend start Naproxen 500mg  twice a day as directed for pain and swelling. Elevate foot as much as possible. Use crutches. Offered boot or post-op shoe. She decided on ace wrap with post-op shoe for support. Continue to apply ice to area today for comfort. Reviewed that injury can take over 1 to 2 weeks to heal or longer. Recommend continue to wash knees with soap and water and follow-up if any signs of infection occur (redness, increased swelling, warmth or discharge). Follow-up with Dr. Vickki Muff in 3 to 4 days if not improving.  Final Clinical Impressions(s) / UC Diagnoses   Final diagnoses:  Right foot sprain, initial encounter  Right ankle swelling  Fall, initial encounter  Abrasion of knee, bilateral     Discharge Instructions     Recommend take Naproxen 500mg  twice a day as directed for pain and swelling. Keep foot elevated as much as possible. Use ace wrap and foot boot as well as crutches to minimize weight-bearing. Continue to apply ice to area today. Recommend follow-up with Dr. Vickki Muff, Podiatrist in [place]  in 3 days if not improving.     ED Prescriptions    Medication Sig Dispense Auth. Provider   naproxen (NAPROSYN) 500 MG tablet Take 1 tablet (500 mg total) by mouth 2 (two) times daily as needed for moderate pain. 20 tablet Sudie GrumblingAmyot, Cleatus Goodin Berry, NP      Controlled Substance Prescriptions [place] Controlled Substance Registry consulted? Not Applicable   Sudie Grumblingmyot, Russie Gulledge Berry, NP 06/22/19 1952

## 2020-11-09 IMAGING — CR RIGHT FOOT COMPLETE - 3+ VIEW
3 series · 3 of 3 positions shown · non-contrast
Comparison: None.

CLINICAL DATA: Fall yesterday, soft tissue swelling. Pain at ball
of foot with pressure.

EXAM:
RIGHT FOOT COMPLETE - 3+ VIEW

[foot ap]
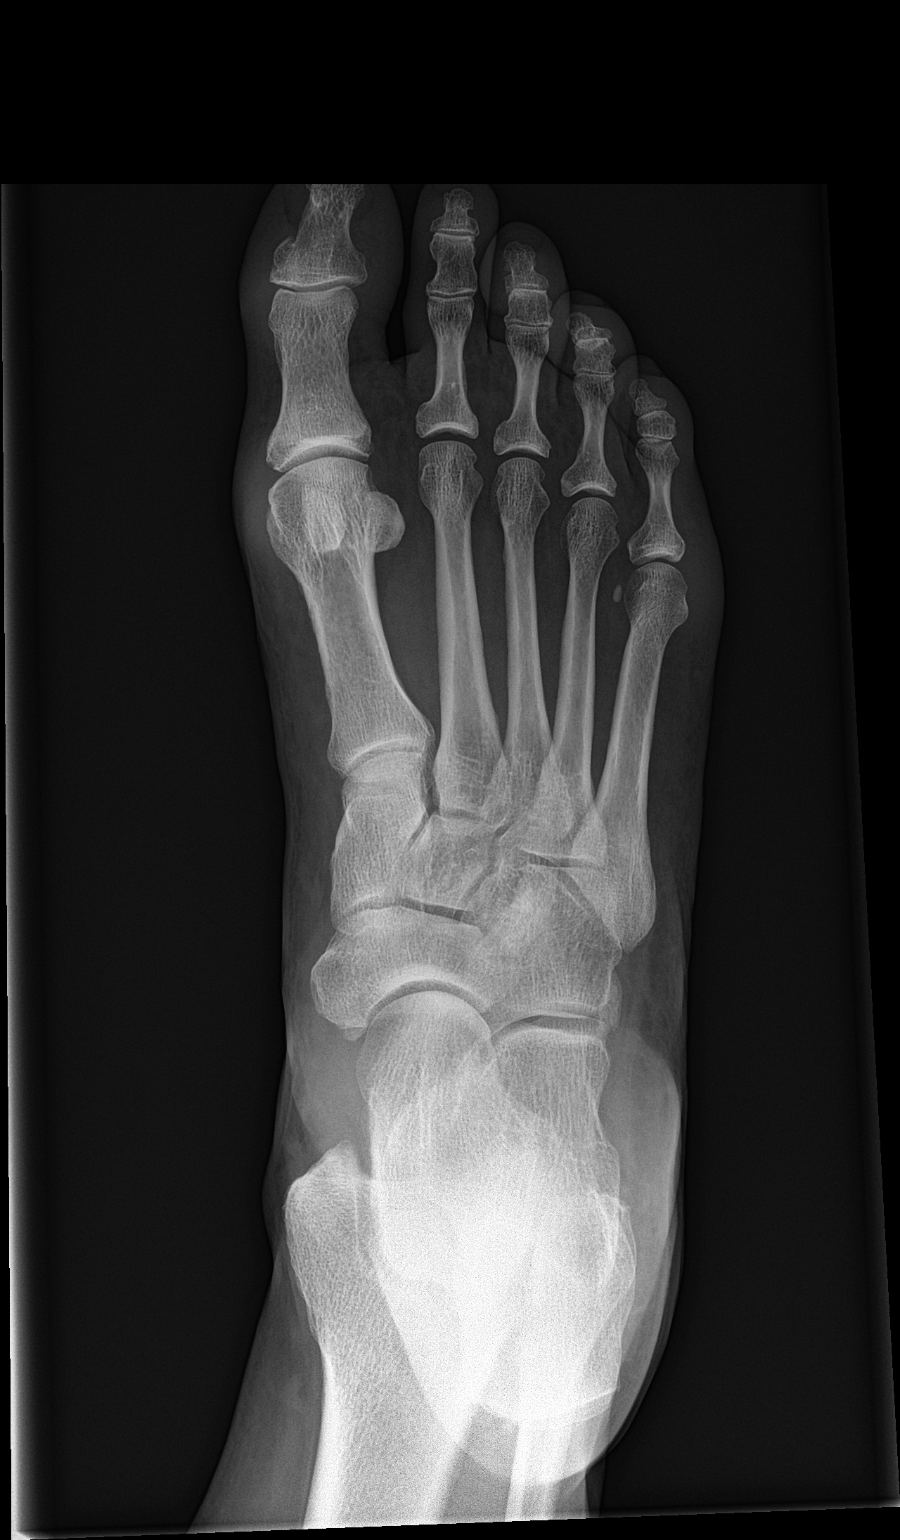

[foot obl]
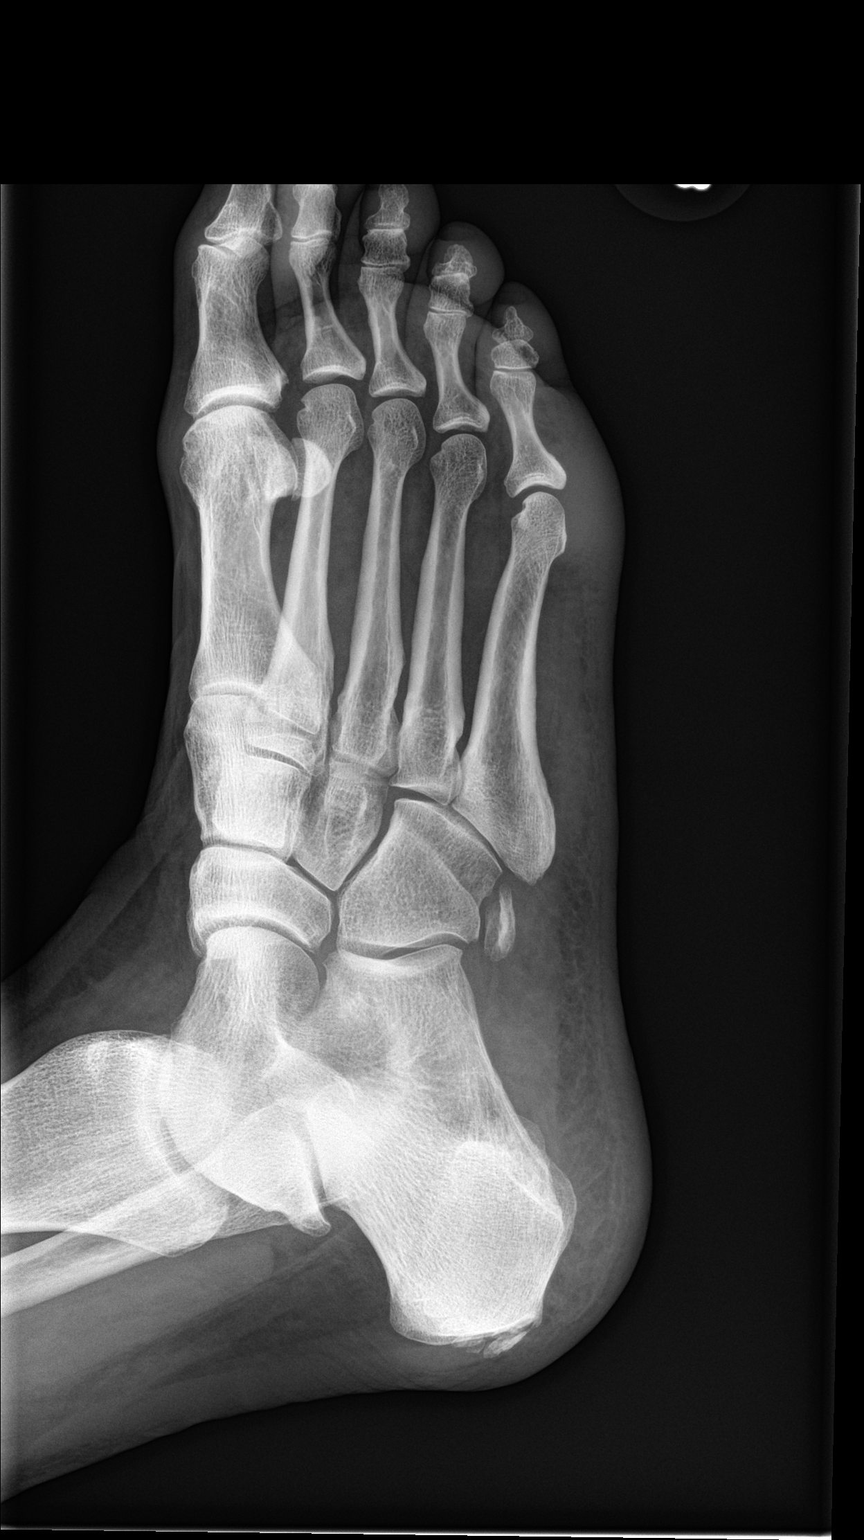

[foot lat]
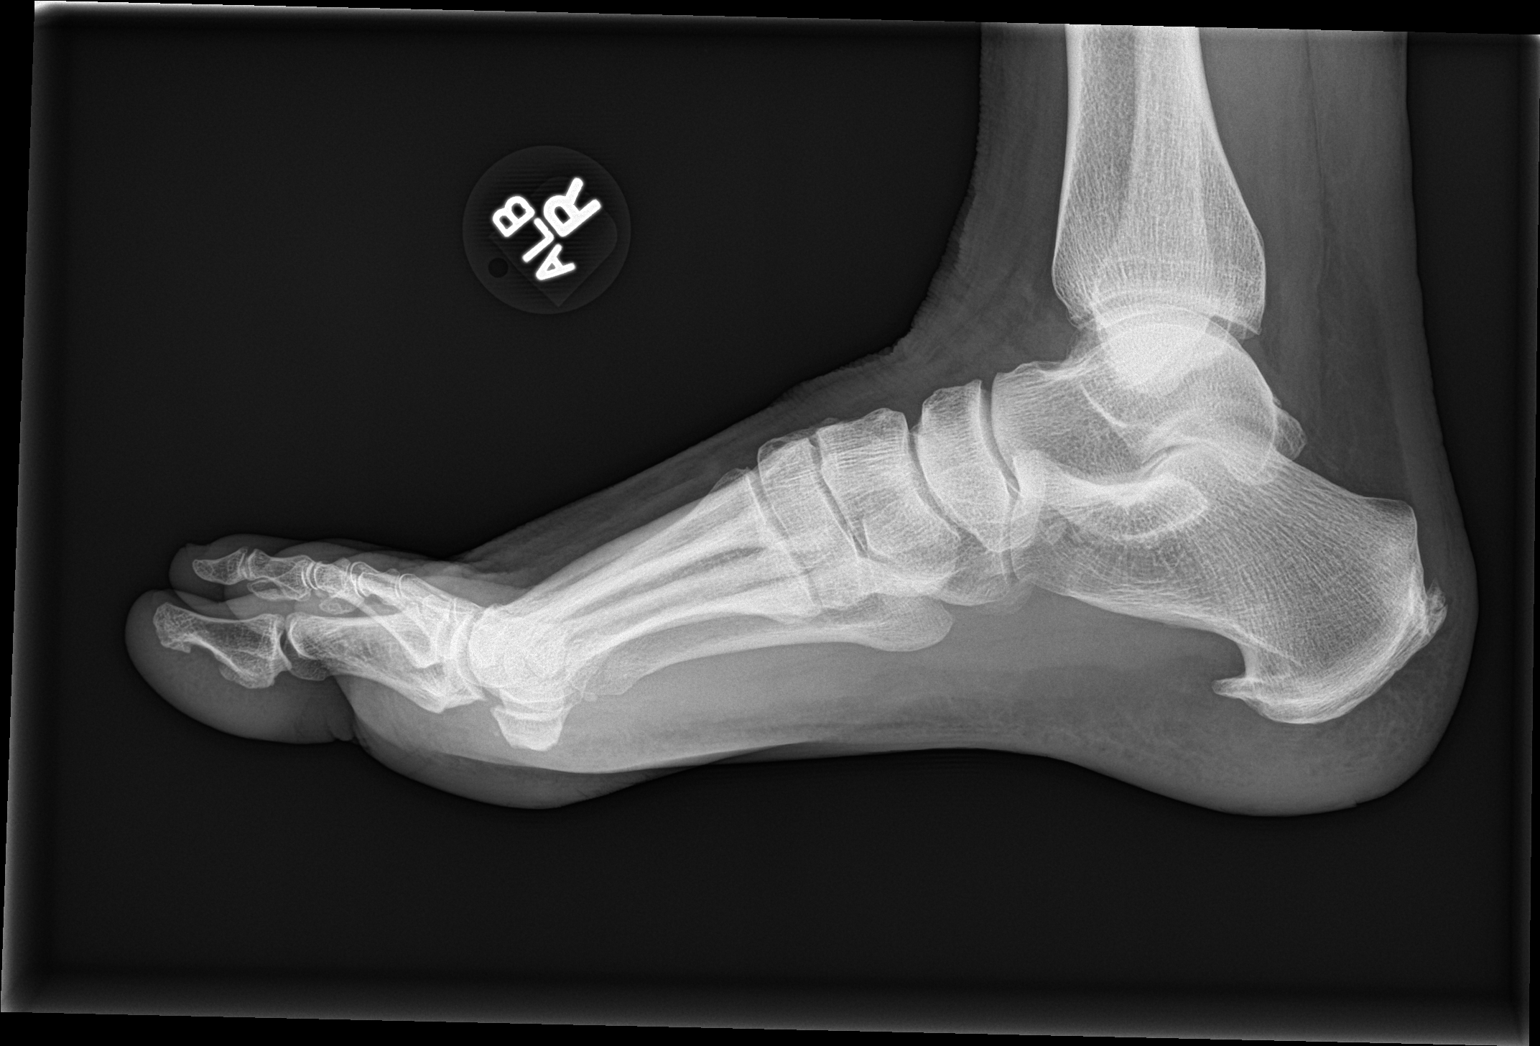

[3 of 3 positions shown; findings below may reference images not displayed]

FINDINGS: Osseous alignment is normal. No fracture line or acutely displaced
fracture fragment seen. Soft tissues about the RIGHT foot are
unremarkable. Chronic degenerative spurring at the plantar and
dorsal margins of the calcaneus.
IMPRESSION: No acute findings. No osseous fracture or dislocation.

## 2022-11-27 ENCOUNTER — Ambulatory Visit
Admission: EM | Admit: 2022-11-27 | Discharge: 2022-11-27 | Disposition: A | Discharge status: Home / Self Care | Payer: BC Managed Care – PPO

## 2022-11-27 DIAGNOSIS — R0602 Shortness of breath: Secondary | ICD-10-CM

## 2022-11-27 DIAGNOSIS — R600 Localized edema: Secondary | ICD-10-CM | POA: Diagnosis not present

## 2022-11-27 NOTE — ED Notes (Signed)
Patient is being discharged from the Urgent Care and sent to the Emergency Department via personal vehicle. Per Cheri Rous NP, patient is in need of higher level of care due to pitting edema, shortness of breath, and unilateral calf swelling. Patient is aware and verbalizes understanding of plan of care.  Vitals:   11/27/22 1525  BP: (!) 114/91  Pulse: 89  Resp: 16  Temp: 98.5 F (36.9 C)  SpO2: 94%

## 2022-11-27 NOTE — Discharge Instructions (Addendum)
Please go to the emergency room for further evaluation and treatment of your symptoms 

## 2022-11-27 NOTE — ED Triage Notes (Signed)
Chief Complaint: Patient had COVID, having left over SOB and fatigue. Joint pain and foot/ankle swelling.   Onset: joint pain and swelling 3 days, SOB and fatigue 2-3 weeks  Prescriptions or OTC medications tried: Yes- Advil    with no relief  Sick exposure: No  New foods, medications, or products: No  Recent Travel: No

## 2022-11-27 NOTE — ED Provider Notes (Signed)
[facility]    CSN: [id number] Arrival date & time: 11/27/22  1404      History   Chief Complaint Chief Complaint  Patient presents with   Shortness of Breath   Joint Pain   Edema    HPI Jessica Cummings is a 61 y.o. female presents for evaluation of swelling and shortness of breath.  Patient reports she had COVID in November.  States since then she has had some shortness of breath primarily with activity that seems to be worsening recently.  Over the past week she has noticed swelling in her hands and her ankles.  She states her hands do sometimes resolve with elevation but the swelling in her feet and legs do not.  She also states she noticed that her right calf is larger than her left calf denies any pain to the calf, redness or warmth.  Denies any history of DVT/PE or coagulopathies.  Denies chest pain.  Does report her father had an MI and passed away at age 22.  Patient denies any personal history of CAD.  Denies smoking history.   Shortness of Breath   History reviewed. No pertinent past medical history.  There are no problems to display for this patient.   History reviewed. No pertinent surgical history.  OB History   No obstetric history on file.      Home Medications    Prior to Admission medications   Medication Sig Start Date End Date Taking? Authorizing Provider  B Complex Vitamins (VITAMIN B-COMPLEX) TABS Take 1 tablet by mouth every morning.   Yes [provider]  Cholecalciferol (D3 PO) Take 1 tablet by mouth every morning.   Yes [provider]  Multiple Vitamin (MULTIVITAMIN ADULT PO) Take 1 tablet by mouth every morning.   Yes [provider]  omeprazole (PRILOSEC) 20 MG capsule Take 20 mg by mouth daily. 06/06/22 10/11/23 Yes [provider]  naproxen (NAPROSYN) 500 MG tablet Take 1 tablet (500 mg total) by mouth 2 (two) times daily as needed for moderate pain. 06/22/19   AmyotAli Lowe, NP    Family  History Family History  Problem Relation Age of Onset   Cancer Mother    Heart disease Father    Thyroid disease Sister     Social History Social History   Tobacco Use   Smoking status: Never   Smokeless tobacco: Never  Vaping Use   Vaping Use: Never used  Substance Use Topics   Alcohol use: No   Drug use: Never     Allergies   Penicillins   Review of Systems Review of Systems  Constitutional:  Positive for fatigue.  Respiratory:  Positive for shortness of breath.   Cardiovascular:  Positive for leg swelling.     Physical Exam Triage Vital Signs ED Triage Vitals  Enc Vitals Group     BP 11/27/22 1525 (!) 114/91     Pulse Rate 11/27/22 1525 89     Resp 11/27/22 1525 16     Temp 11/27/22 1525 98.5 F (36.9 C)     Temp Source 11/27/22 1525 Oral     SpO2 11/27/22 1525 94 %     Weight 11/27/22 1527 235 lb (106.6 kg)     Height 11/27/22 1527 5\' 7"  (1.702 m)     Head Circumference --      Peak Flow --      Pain Score 11/27/22 1521 3     Pain Loc --  Pain Edu? --      Excl. in GC? --    No data found.  Updated Vital Signs BP (!) 114/91 (BP Location: Left Arm)   Pulse 89   Temp 98.5 F (36.9 C) (Oral)   Resp 16   Ht 5\' 7"  (1.702 m)   Wt 235 lb (106.6 kg)   SpO2 94%   BMI 36.81 kg/m   Visual Acuity Right Eye Distance:   Left Eye Distance:   Bilateral Distance:    Right Eye Near:   Left Eye Near:    Bilateral Near:     Physical Exam Vitals and nursing note reviewed.  Constitutional:      Appearance: She is well-developed.  HENT:     Head: Normocephalic and atraumatic.  Eyes:     Pupils: Pupils are equal, round, and reactive to light.  Cardiovascular:     Rate and Rhythm: Normal rate and regular rhythm.     Heart sounds: Normal heart sounds.  Pulmonary:     Effort: Pulmonary effort is normal. No respiratory distress.     Breath sounds: Normal breath sounds.  Skin:    General: Skin is warm and dry.     Comments: +2 pitting edema  bilaterally right greater than left.  Right extends up to knee left extends to mid shin.  Right calf measures 45 cm, left calf measures 41 cm.  There is no tenderness to palpation along the posterior calf bilaterally, erythema, or warmth.  Neurological:     General: No focal deficit present.     Mental Status: She is alert and oriented to person, place, and time.  Psychiatric:        Mood and Affect: Mood normal.        Behavior: Behavior normal.    Clinical feature Score     Active cancer (treatment ongoing or within the previous six months or palliative) 0  Paralysis, paresis, or recent plaster immobilization of the lower extremities 0  Recently bedridden for more than three days or major surgery, within four weeks                                                    0  Localized tenderness along the distribution of the deep venous system 0  Entire leg swollen 0  Calf swelling by more than 3 cm when compared to the asymptomatic leg (measured below tibial tuberosity) 1  Pitting edema (greater in the symptomatic leg) 1  Collateral superficial veins (nonvaricose) 1  Alternative diagnosis as likely or more likely than that of deep venous thrombosis -2  Total Score 3     Interpretation    High probability  3 or greater  Moderate probability 1 or 2  Low probability 0 or less  Modification   This clinical model has been modified to take one other clinical feature into account: a previously documented deep vein thrombosis (DVT) is given the score of 1. Using this modified scoring system, DVT is either likely or unlikely, as follows:  DVT likely 2 or greater   DVT unlikely 1 or less    PERC Criteria for low probability for Pulmonary Embolism  Age <50 years  Heart rate <100 beats/minute  Oxyhemoglobin saturation ?95 percent  No  hemoptysis  No estrogen use  No prior DVT or PE  No unilateral leg swelling  No surgery/trauma requiring hospitalization within the prior four weeks  Total score: 3    UC Treatments / Results  Labs (all labs ordered are listed, but only abnormal results are displayed) Labs Reviewed - No data to display  EKG   Radiology No results found.  Procedures Procedures (including critical care time)  Medications Ordered in UC Medications - No data to display  Initial Impression / Assessment and Plan / UC Course  I have reviewed the triage vital signs and the nursing notes.  Pertinent labs & imaging results that were available during my care of the patient were reviewed by me and considered in my medical decision making (see chart for details).     Discussed at length with patient exam and her symptoms.  Also reviewed limitations and abilities of urgent care.  Given her pitting edema, shortness of breath, and unilateral calf swelling advise she go to the emergency room for further evaluation.  Patient in agreement with plan will go POV with her husband driving to the emergency room.  She was instructed to pull over and call 911 for any worsening symptoms that occur in transit. Final Clinical Impressions(s) / UC Diagnoses   Final diagnoses:  None   Discharge Instructions   None    ED Prescriptions   None    PDMP not reviewed this encounter.   Radford Pax, NP 11/27/22 [phone]

## 2022-11-28 ENCOUNTER — Ambulatory Visit: Payer: Self-pay
